# Patient Record
Sex: Female | Born: 1965 | Race: White | Hispanic: No | Marital: Married
Health system: Southern US, Community
[De-identification: ages and names within clinical notes are randomized; demographics above are authoritative.]

## PROBLEM LIST (undated history)

## (undated) DIAGNOSIS — I10 Essential (primary) hypertension: Secondary | ICD-10-CM

## (undated) DIAGNOSIS — C801 Malignant (primary) neoplasm, unspecified: Secondary | ICD-10-CM

---

## 2018-12-27 ENCOUNTER — Emergency Department: Payer: BLUE CROSS/BLUE SHIELD

## 2018-12-27 ENCOUNTER — Other Ambulatory Visit: Payer: Self-pay

## 2018-12-27 ENCOUNTER — Emergency Department
Admission: EM | Admit: 2018-12-27 | Discharge: 2018-12-27 | Disposition: A | Discharge status: Home / Self Care | Payer: BLUE CROSS/BLUE SHIELD | Attending: Emergency Medicine | Admitting: Emergency Medicine

## 2018-12-27 DIAGNOSIS — Z853 Personal history of malignant neoplasm of breast: Secondary | ICD-10-CM | POA: Diagnosis not present

## 2018-12-27 DIAGNOSIS — S42341A Displaced spiral fracture of shaft of humerus, right arm, initial encounter for closed fracture: Secondary | ICD-10-CM | POA: Diagnosis not present

## 2018-12-27 DIAGNOSIS — W2209XA Striking against other stationary object, initial encounter: Secondary | ICD-10-CM | POA: Diagnosis not present

## 2018-12-27 DIAGNOSIS — S4991XA Unspecified injury of right shoulder and upper arm, initial encounter: Secondary | ICD-10-CM | POA: Diagnosis present

## 2018-12-27 DIAGNOSIS — Y9301 Activity, walking, marching and hiking: Secondary | ICD-10-CM | POA: Diagnosis not present

## 2018-12-27 DIAGNOSIS — Y9289 Other specified places as the place of occurrence of the external cause: Secondary | ICD-10-CM | POA: Diagnosis not present

## 2018-12-27 DIAGNOSIS — Y998 Other external cause status: Secondary | ICD-10-CM | POA: Diagnosis not present

## 2018-12-27 HISTORY — DX: Malignant (primary) neoplasm, unspecified: C80.1

## 2018-12-27 HISTORY — DX: Essential (primary) hypertension: I10

## 2018-12-27 LAB — CBC WITH DIFFERENTIAL/PLATELET
Abs Immature Granulocytes: 0.01 10*3/uL (ref 0.00–0.07)
Basophils Absolute: 0 10*3/uL (ref 0.0–0.1)
Basophils Relative: 1 %
Eosinophils Absolute: 0 10*3/uL (ref 0.0–0.5)
Eosinophils Relative: 0 %
HCT: 25.3 % — ABNORMAL LOW (ref 36.0–46.0)
Hemoglobin: 9.2 g/dL — ABNORMAL LOW (ref 12.0–15.0)
IMMATURE GRANULOCYTES: 0 %
LYMPHS ABS: 0.3 10*3/uL — AB (ref 0.7–4.0)
Lymphocytes Relative: 13 %
MCH: 42.6 pg — ABNORMAL HIGH (ref 26.0–34.0)
MCHC: 36.4 g/dL — ABNORMAL HIGH (ref 30.0–36.0)
MCV: 117.1 fL — ABNORMAL HIGH (ref 80.0–100.0)
Monocytes Absolute: 0.1 10*3/uL (ref 0.1–1.0)
Monocytes Relative: 6 %
NEUTROS ABS: 2 10*3/uL (ref 1.7–7.7)
Neutrophils Relative %: 80 %
Platelets: 131 10*3/uL — ABNORMAL LOW (ref 150–400)
RBC: 2.16 MIL/uL — ABNORMAL LOW (ref 3.87–5.11)
RDW: 14.8 % (ref 11.5–15.5)
Smear Review: NORMAL
WBC: 2.5 10*3/uL — ABNORMAL LOW (ref 4.0–10.5)
nRBC: 0 % (ref 0.0–0.2)

## 2018-12-27 LAB — COMPREHENSIVE METABOLIC PANEL
ALT: 59 U/L — ABNORMAL HIGH (ref 0–44)
AST: 68 U/L — ABNORMAL HIGH (ref 15–41)
Albumin: 4 g/dL (ref 3.5–5.0)
Alkaline Phosphatase: 69 U/L (ref 38–126)
Anion gap: 14 (ref 5–15)
BUN: 8 mg/dL (ref 6–20)
CO2: 23 mmol/L (ref 22–32)
Calcium: 8.6 mg/dL — ABNORMAL LOW (ref 8.9–10.3)
Chloride: 97 mmol/L — ABNORMAL LOW (ref 98–111)
Creatinine, Ser: 0.45 mg/dL (ref 0.44–1.00)
GFR calc Af Amer: >60 mL/min (ref >60)
GFR calc non Af Amer: >60 mL/min (ref >60)
GLUCOSE: 122 mg/dL — AB (ref 70–99)
POTASSIUM: 3 mmol/L — AB (ref 3.5–5.1)
Sodium: 134 mmol/L — ABNORMAL LOW (ref 135–145)
TOTAL PROTEIN: 6.9 g/dL (ref 6.5–8.1)
Total Bilirubin: 0.8 mg/dL (ref 0.3–1.2)

## 2018-12-27 MED ORDER — ONDANSETRON 4 MG PO TBDP: 4.0000 mg | ORAL_TABLET | Freq: Three times a day (TID) | ORAL | 0 refills | Status: AC | PRN

## 2018-12-27 MED ORDER — ONDANSETRON HCL 4 MG/2ML IJ SOLN
4.0000 mg | Freq: Once | INTRAMUSCULAR | Status: AC
  Administered 2018-12-27: 4 mg via INTRAVENOUS
  Filled 2018-12-27: qty 2

## 2018-12-27 MED ORDER — OXYCODONE-ACETAMINOPHEN 5-325 MG PO TABS: 1.0000 | ORAL_TABLET | ORAL | 0 refills | Status: AC | PRN

## 2018-12-27 MED ORDER — OXYCODONE-ACETAMINOPHEN 5-325 MG PO TABS
2.0000 | ORAL_TABLET | Freq: Once | ORAL | Status: AC
  Administered 2018-12-27: 2 via ORAL
  Filled 2018-12-27: qty 2

## 2018-12-27 MED ORDER — HYDROMORPHONE HCL 1 MG/ML IJ SOLN
1.0000 mg | Freq: Once | INTRAMUSCULAR | Status: AC
  Administered 2018-12-27: 1 mg via INTRAVENOUS
  Filled 2018-12-27: qty 1

## 2018-12-27 NOTE — ED Triage Notes (Signed)
Pt comes from a hotel via EMS after hitting her right upper arm on a door frame. Pt is also a cancer pt currently on chemo. Pt had 44mcg fent with EMS. Pt had a 20G IV started with EMS. Pt also had fludis started for an initial pressure of 96 systolic per EMS and most recent with EMS was 122/83.

## 2018-12-27 NOTE — Discharge Instructions (Signed)
Please follow-up with orthopedics when she return home as soon as possible.  Please bring your CD of images with you.  Return to the emergency department for any worsening pain, if you have any numbness, weakness or coldness to your right distal arm/hand.  Please take your pain medication as needed, but only as prescribed.

## 2018-12-27 NOTE — ED Notes (Signed)
Patient transported to CT 

## 2018-12-27 NOTE — ED Provider Notes (Signed)
[facility] Emergency Department Provider Note  Time seen: 11:08 AM  I have reviewed the triage vital signs and the nursing notes.   HISTORY  Chief Complaint Arm Pain    HPI Wendy Turner is a 53 y.o. female with a past medical history of breast cancer currently on chemotherapy with known bony metastases presents to the emergency department for right arm pain.  According to the patient this morning around 2:00 she ran into a door by accident.  States she was able to get back into bed, continued to have pain throughout the night and this morning significant pain with any attempted movement such came to the emergency department for evaluation.  Patient denies any numbness to the arm.  Patient states her last chemotherapy was in December, but has had low platelets in the past.   No past medical history on file.  There are no active problems to display for this patient.   Prior to Admission medications   Not on File    Allergies not on file  No family history on file.  Social History Social History   Tobacco Use  . Smoking status: Not on file  Substance Use Topics  . Alcohol use: Not on file  . Drug use: Not on file    Review of Systems Constitutional: Negative for fever.  Appears to be in pain holding her right arm. Cardiovascular: Negative for chest pain. Respiratory: Negative for shortness of breath. Gastrointestinal: Negative for abdominal pain Genitourinary: Negative for urinary compaints Musculoskeletal: Right arm pain Skin: Negative for skin complaints  Neurological: Negative for headache All other ROS negative  ____________________________________________   PHYSICAL EXAM:  Constitutional: Alert and oriented. Well appearing and in no distress. Eyes: Normal exam ENT   Head: Normocephalic and atraumatic   Mouth/Throat: Mucous membranes are moist. Cardiovascular: Normal rate, regular rhythm. No murmur Respiratory: Normal  respiratory effort without tachypnea nor retractions. Breath sounds are clear  Gastrointestinal: Soft and nontender. No distention.  Musculoskeletal: Significant tenderness of mid humerus, moderate hematoma to the area but remains soft, no concern for compartment syndrome.  Neurovascular intact distally. Neurologic:  Normal speech and language. No gross focal neurologic deficits Skin:  Skin is warm, dry and intact.  Psychiatric: Mood and affect are normal     RADIOLOGY  IMPRESSION: 1. Comminuted spiral fracture of the humeral diaphysis. 2. Impacted fracture of the humerus surgical neck.  IMPRESSION: Comminuted, segmental fracture of the right humerus as described above. While the fracture can not be definitively characterized, no finding to suggest a pathologic injury is identified.  ____________________________________________   INITIAL IMPRESSION / ASSESSMENT AND PLAN / ED COURSE  Pertinent labs & imaging results that were available during my care of the patient were reviewed by me and considered in my medical decision making (see chart for details).  Patient presents to the emergency department after a minimal trauma with significant right arm pain.  Differential this time would include contusion, hematoma, fracture, pathologic fracture.  We will start with lab work and x-ray imaging of the arm.  Patient agreeable to plan of care.  We will treat pain prior to imaging.  Patient's x-ray consistent with comminuted spiral fracture.  I discussed patient with Dr. Arnette Schaumann of orthopedic surgery.  He does not feel comfortable with the operation needed to repair this.  States patient could be splinted in place and follow-up as an outpatient at a tertiary care center versus being transferred to a tertiary care center.  I discussed  with the patient, her brother is actually an Doctor, general practice in [place].  We provided x-rays images to the brother, we obtained a CT scan of the arm, comminuted  spiral fracture however no obvious pathologic injury.  We will place in a long-arm splint.  Patient wishes to be discharged home and return to [place].  She has someone to drive her back to [place].  The brother will help arrange for her follow-up.  We will obtain images for the patient on a disc prior to discharge.  After splint placement patient remains neurovascularly intact.  No signs of radial nerve injury.  ____________________________________________   FINAL CLINICAL IMPRESSION(S) / ED DIAGNOSES  Right arm pain Comminuted humerus fracture   Harvest Dark, MD 12/27/18 1539

## 2020-02-12 IMAGING — CT CT HUMERUS*R* W/O CM
2 of 3 series · 11 of 33 positions shown, 13 images · non-contrast
Comparison: Plain films the right upper arm this same day.

CLINICAL DATA: Patient with a history of breast cancer who suffered
a right upper arm fracture after hitting her arm on a door frame
today. Initial encounter.

EXAM:
CT OF THE RIGHT HUMERUS WITHOUT CONTRAST
TECHNIQUE: Multidetector CT imaging was performed according to the standard
protocol. Multiplanar CT image reconstructions were also generated.

[Series 7: ax st · axial · 0.29mm/px · z∈[-305,-45]mm · 8 of 212 slices shown, 10 images]
[im 17/212  soft-tissue]
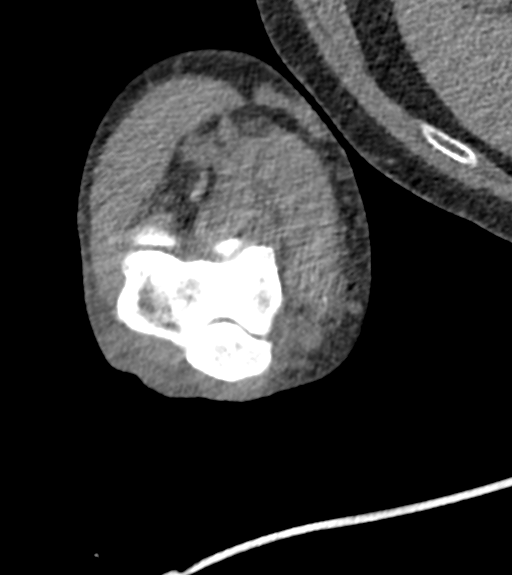
[im 17/212  bone]
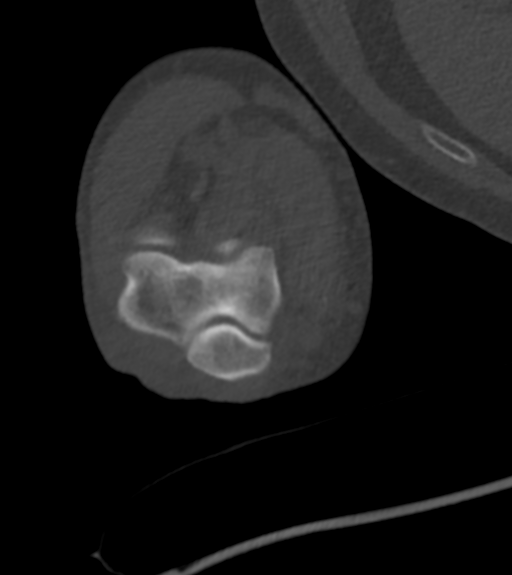
[im 49/212  bone]
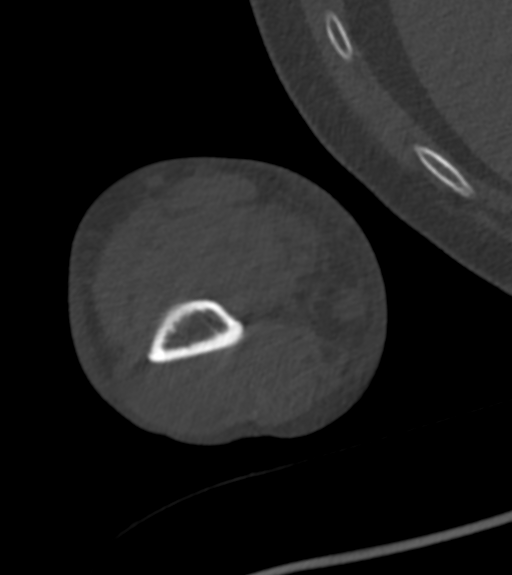
[im 65/212  bone]
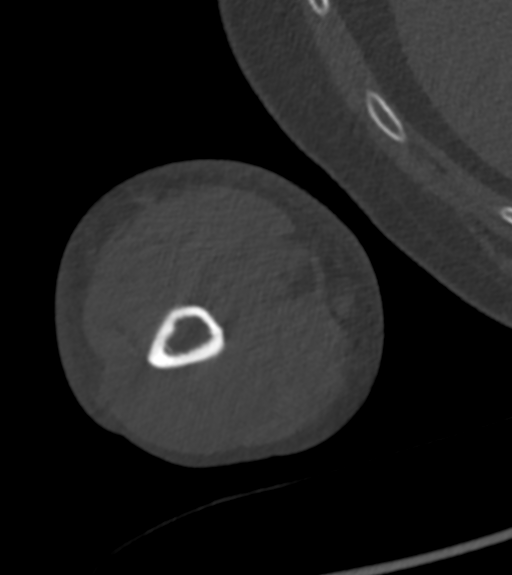
[im 98/212  bone]
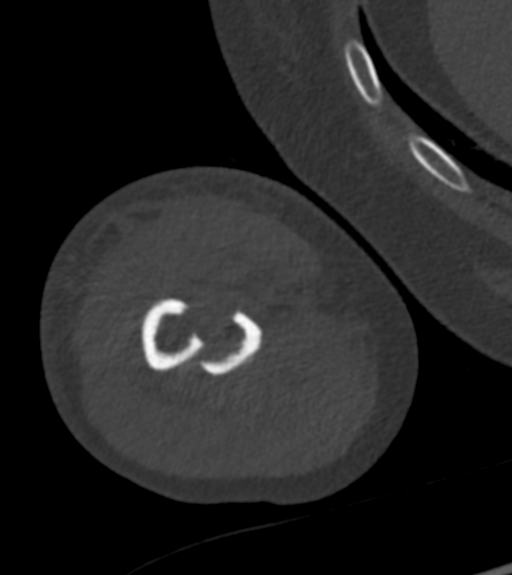
[im 114/212  soft-tissue]
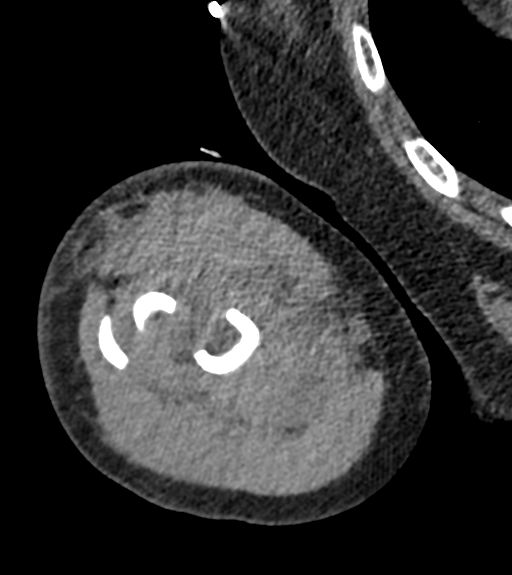
[im 114/212  bone]
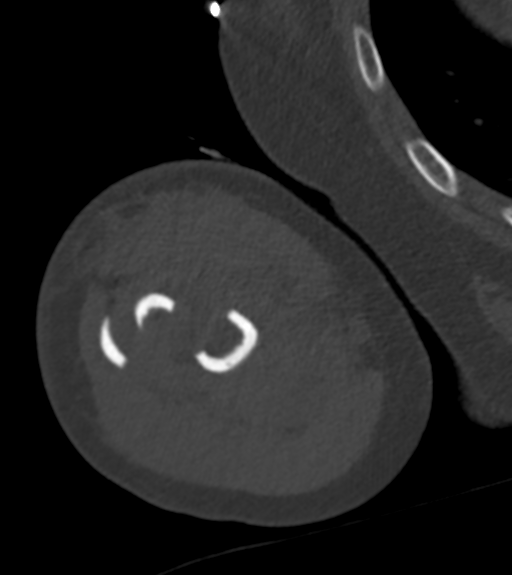
[im 147/212  bone]
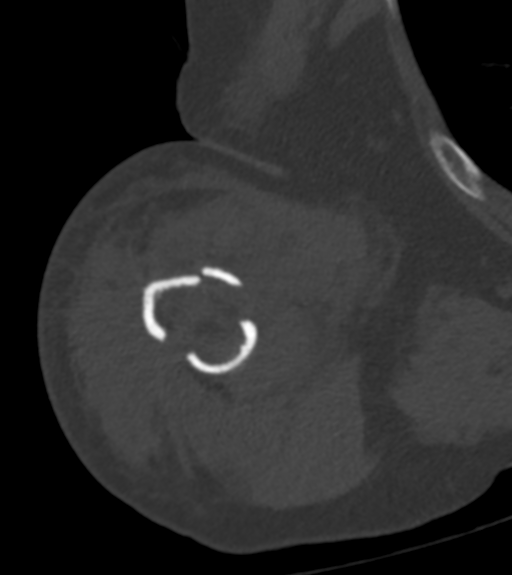
[im 163/212  bone]
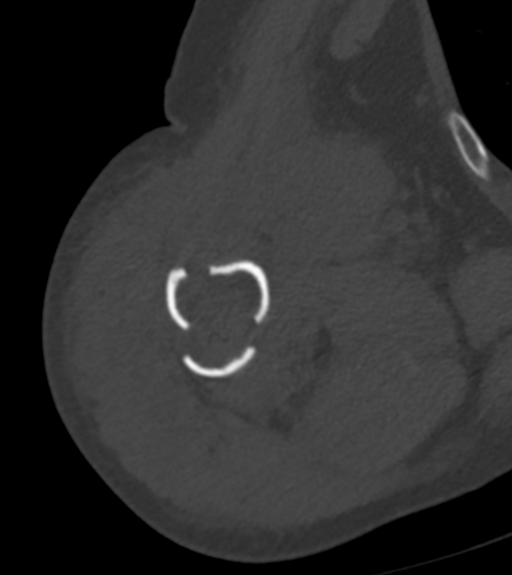
[im 195/212  bone]
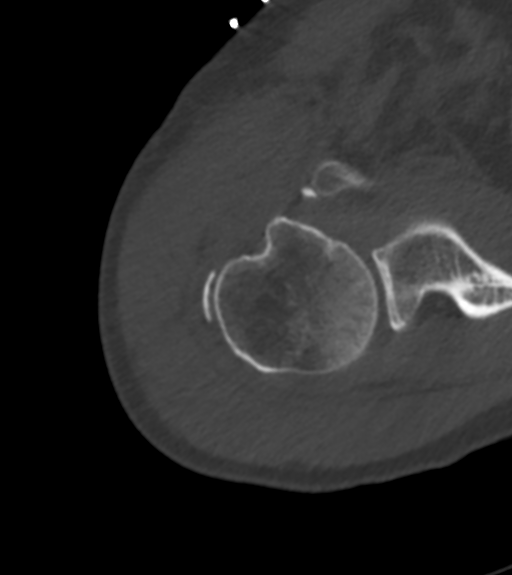

[Series 8: cor st · coronal · 0.29mm/px · 3 of 79 slices shown]
[im 16/79  bone]
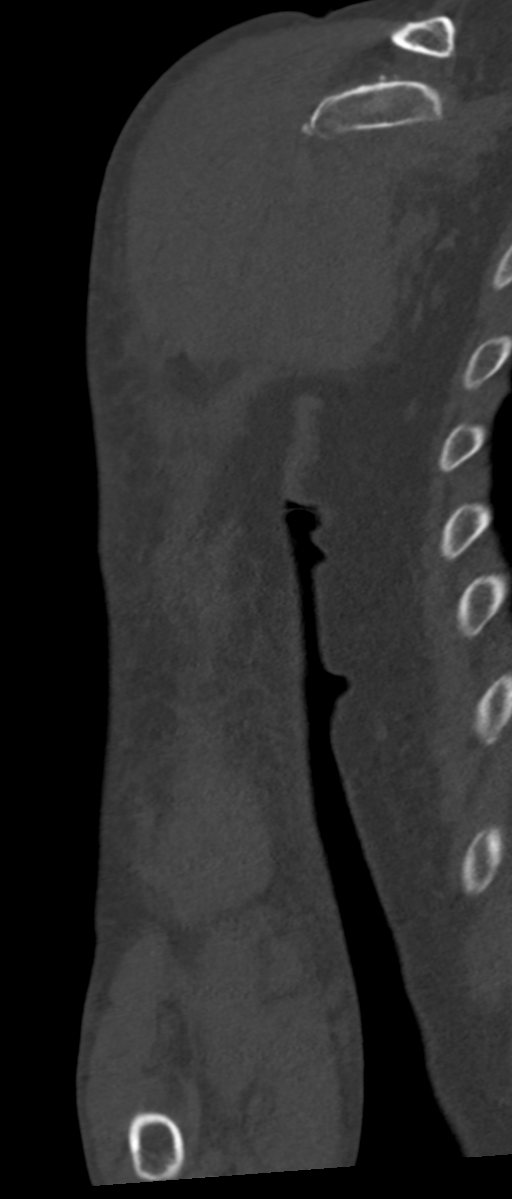
[im 32/79  bone]
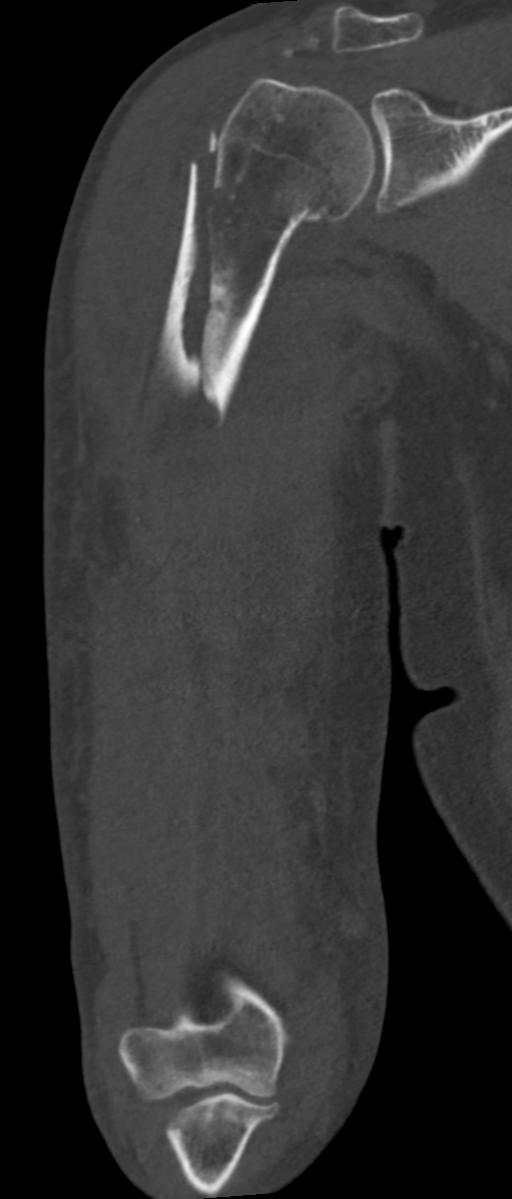
[im 47/79  bone]
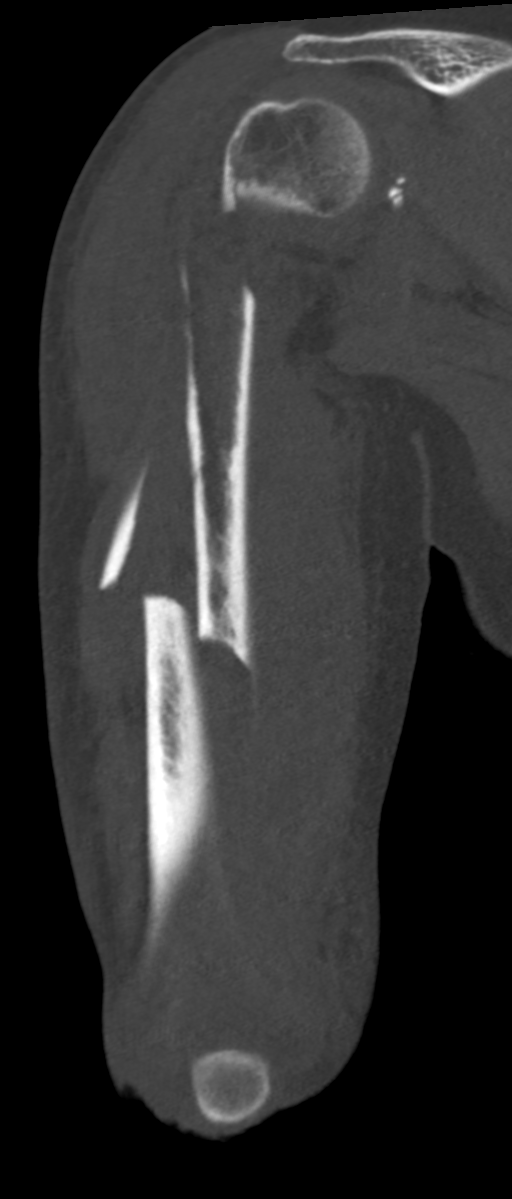

[11 of 33 positions shown; findings below may reference images not displayed]

FINDINGS: Bones/Joint/Cartilage

Comminuted, segmental fracture of the humerus is identified as seen
on the prior examination. The proximal component of the fracture
extends through the greater tuberosity on the lateral side in an
inferior and medial orientation through the diaphysis. There is
posterior displacement of the distal fragment approximately 2 cm and
foreshortening of approximately 2.6 cm. The mid diaphysis is
distracted up to 2 cm. The fracture exits the lateral cortex of the
diaphysis approximately 15 cm below the top of the humeral head and
the medial cortex of the diaphysis approximately 19 cm below the top
of the humeral head. Fragment override distally of approximately 2
cm is seen. No bony destructive change, endosteal scalloping or
sclerosis is seen about the fracture. No periosteal reaction is
identified. No soft tissue mass is seen.

Mild acromioclavicular degenerative change is noted. The humeral
head is located. Incidentally imaged scapula and ribs appear normal.

Ligaments

Suboptimally assessed by CT.

Muscles and Tendons

Intact.

Soft tissues

There is some soft tissue swelling and hematoma about the patient's
fracture. No soft tissue mass is identified.
IMPRESSION: Comminuted, segmental fracture of the right humerus as described
above. While the fracture can not be definitively characterized, no
finding to suggest a pathologic injury is identified.

## 2020-02-12 IMAGING — CR DG HUMERUS 2V *R*
2 series · 2 of 2 positions shown · non-contrast
Comparison: None.

CLINICAL DATA: Fall.

EXAM:
RIGHT HUMERUS - 2+ VIEW

[humerus lat]
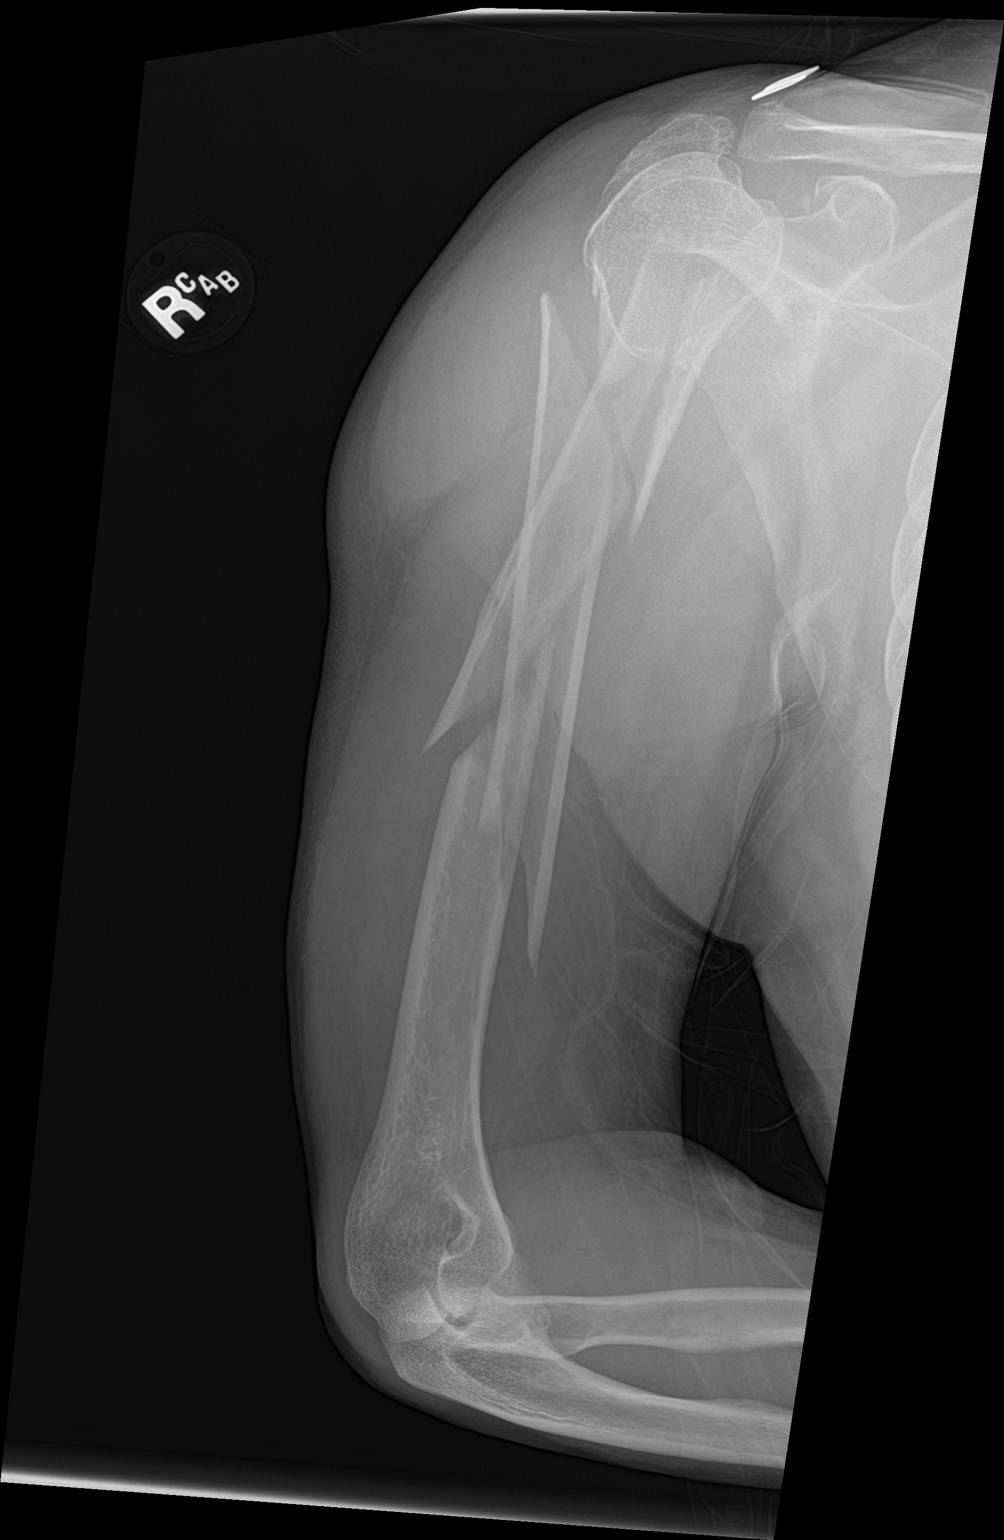

[humerus ap]
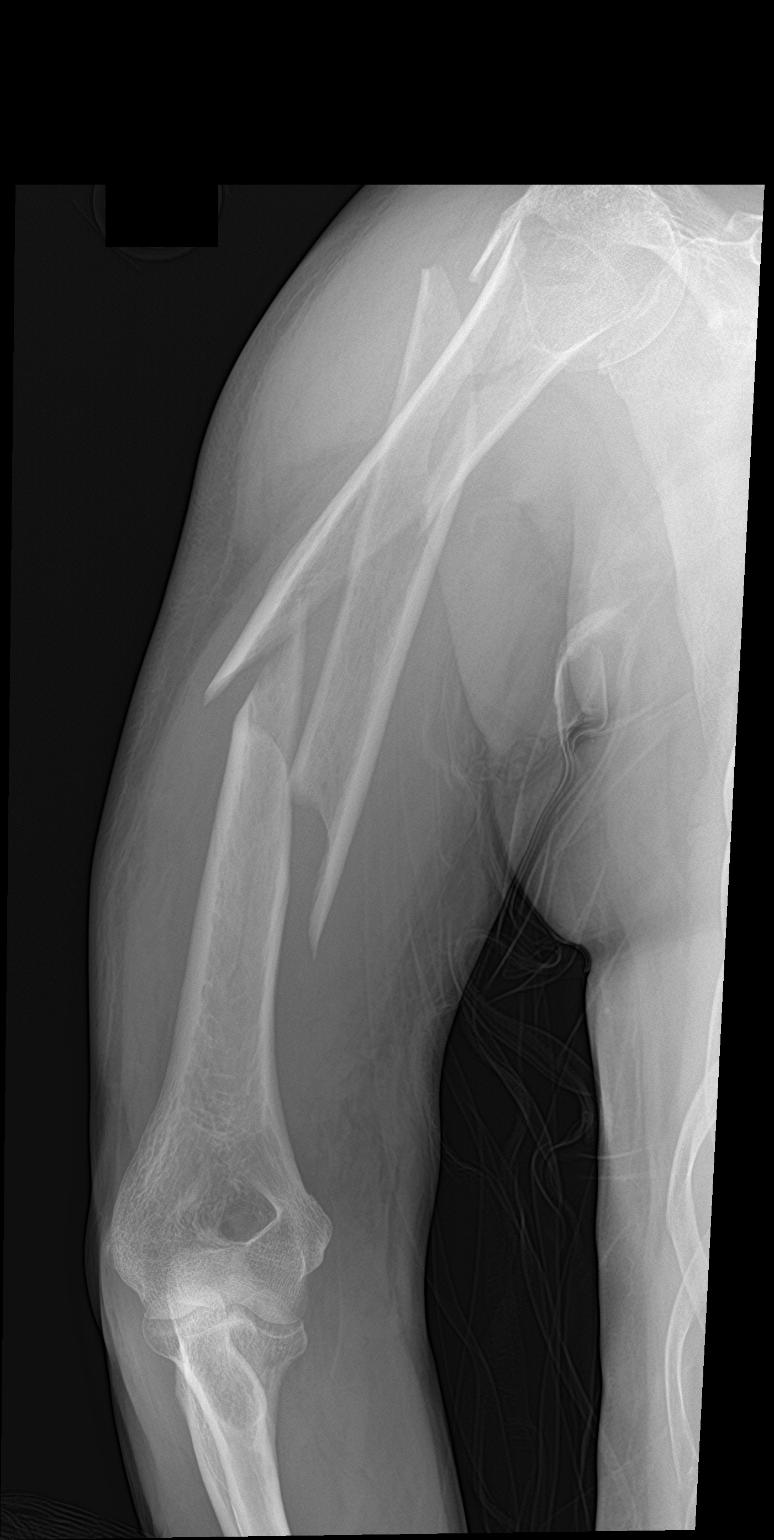

[2 of 2 positions shown; findings below may reference images not displayed]

FINDINGS: Comminuted spiral fracture of the proximal and mid humeral diaphysis
with large butterfly fragment. There is prominent medial and slight
anterior angulation of the distal fragment. Impacted fracture of the
humerus surgical neck. No shoulder or elbow dislocation.
IMPRESSION: 1. Comminuted spiral fracture of the humeral diaphysis.
2. Impacted fracture of the humerus surgical neck.
